# Patient Record
Sex: Female | Born: 1960 | Race: Black or African American | Hispanic: No | Smoking: Never smoker
Health system: Southern US, Community
[De-identification: ages and names within clinical notes are randomized; demographics above are authoritative.]

## PROBLEM LIST (undated history)

## (undated) DIAGNOSIS — I10 Essential (primary) hypertension: Secondary | ICD-10-CM

## (undated) DIAGNOSIS — E119 Type 2 diabetes mellitus without complications: Secondary | ICD-10-CM

## (undated) DIAGNOSIS — E785 Hyperlipidemia, unspecified: Secondary | ICD-10-CM

## (undated) HISTORY — PX: CHOLECYSTECTOMY: SHX55

## (undated) HISTORY — PX: ABDOMINAL HYSTERECTOMY: SHX81

---

## 2014-09-22 ENCOUNTER — Encounter (HOSPITAL_BASED_OUTPATIENT_CLINIC_OR_DEPARTMENT_OTHER): Payer: Self-pay

## 2014-09-22 ENCOUNTER — Emergency Department (HOSPITAL_BASED_OUTPATIENT_CLINIC_OR_DEPARTMENT_OTHER)
Admission: EM | Admit: 2014-09-22 | Discharge: 2014-09-22 | Disposition: A | Discharge status: Home / Self Care | Payer: BC Managed Care – PPO | Attending: Emergency Medicine | Admitting: Emergency Medicine

## 2014-09-22 ENCOUNTER — Emergency Department (HOSPITAL_BASED_OUTPATIENT_CLINIC_OR_DEPARTMENT_OTHER): Payer: BC Managed Care – PPO

## 2014-09-22 DIAGNOSIS — I10 Essential (primary) hypertension: Secondary | ICD-10-CM | POA: Diagnosis not present

## 2014-09-22 DIAGNOSIS — E785 Hyperlipidemia, unspecified: Secondary | ICD-10-CM | POA: Insufficient documentation

## 2014-09-22 DIAGNOSIS — Y9389 Activity, other specified: Secondary | ICD-10-CM | POA: Diagnosis not present

## 2014-09-22 DIAGNOSIS — Z791 Long term (current) use of non-steroidal anti-inflammatories (NSAID): Secondary | ICD-10-CM | POA: Diagnosis not present

## 2014-09-22 DIAGNOSIS — W010XXA Fall on same level from slipping, tripping and stumbling without subsequent striking against object, initial encounter: Secondary | ICD-10-CM | POA: Insufficient documentation

## 2014-09-22 DIAGNOSIS — S99922A Unspecified injury of left foot, initial encounter: Secondary | ICD-10-CM | POA: Diagnosis present

## 2014-09-22 DIAGNOSIS — Z79899 Other long term (current) drug therapy: Secondary | ICD-10-CM | POA: Diagnosis not present

## 2014-09-22 DIAGNOSIS — Y92009 Unspecified place in unspecified non-institutional (private) residence as the place of occurrence of the external cause: Secondary | ICD-10-CM | POA: Insufficient documentation

## 2014-09-22 DIAGNOSIS — Y998 Other external cause status: Secondary | ICD-10-CM | POA: Insufficient documentation

## 2014-09-22 DIAGNOSIS — E119 Type 2 diabetes mellitus without complications: Secondary | ICD-10-CM | POA: Insufficient documentation

## 2014-09-22 DIAGNOSIS — Z880 Allergy status to penicillin: Secondary | ICD-10-CM | POA: Diagnosis not present

## 2014-09-22 DIAGNOSIS — S93602A Unspecified sprain of left foot, initial encounter: Secondary | ICD-10-CM | POA: Diagnosis not present

## 2014-09-22 HISTORY — DX: Essential (primary) hypertension: I10

## 2014-09-22 HISTORY — DX: Hyperlipidemia, unspecified: E78.5

## 2014-09-22 HISTORY — DX: Type 2 diabetes mellitus without complications: E11.9

## 2014-09-22 MED ORDER — TRAMADOL HCL 50 MG PO TABS: 50.0000 mg | ORAL_TABLET | Freq: Four times a day (QID) | ORAL | Status: AC | PRN

## 2014-09-22 MED ORDER — MELOXICAM 7.5 MG PO TABS: 7.5000 mg | ORAL_TABLET | Freq: Every day | ORAL | Status: AC

## 2014-09-22 MED ORDER — HYDROCODONE-ACETAMINOPHEN 5-325 MG PO TABS
1.0000 | ORAL_TABLET | Freq: Once | ORAL | Status: AC
  Administered 2014-09-22: 1 via ORAL
  Filled 2014-09-22: qty 1

## 2014-09-22 NOTE — ED Notes (Signed)
Anna Fernandez EMT is teaching Pt. About ASO and Crutch teaching with Pt.   Pt. Verbalized d'c instructions.

## 2014-09-22 NOTE — ED Notes (Signed)
Left foot pain. Reports tripping yesterday. No swelling ntoed.

## 2014-09-22 NOTE — ED Provider Notes (Signed)
CSN: [id number]     Arrival date & time 09/22/14  1740 History   First MD Initiated Contact with Patient 09/22/14 1833     Chief Complaint  Patient presents with  . Foot Pain     (Consider location/radiation/quality/duration/timing/severity/associated sxs/prior Treatment) HPI  Pt is a 54yo female presenting to ED with c/o gradually worsening Left foot pain that started yesterday after pt tripped on a step at home. Denies falling or hitting her head. No other injuries. Pain is aching and throbbing, 10/10 at worst. Worse with weight bearing. Advil tried at home w/o relief. No other injuries.   Past Medical History  Diagnosis Date  . Diabetes mellitus without complication   . Hypertension   . Hyperlipidemia    Past Surgical History  Procedure Laterality Date  . Abdominal hysterectomy    . Cholecystectomy     No family history on file. History  Substance Use Topics  . Smoking status: Never Smoker   . Smokeless tobacco: Not on file  . Alcohol Use: No   OB History    No data available     Review of Systems  Musculoskeletal: Positive for myalgias, joint swelling, arthralgias and gait problem. Negative for back pain, neck pain and neck stiffness.       Left foot  Skin: Negative for color change and wound.  All other systems reviewed and are negative.     Allergies  Penicillins  Home Medications   Prior to Admission medications   Medication Sig Start Date End Date Taking? Authorizing Provider  lamoTRIgine (LAMICTAL) 100 MG tablet Take 100 mg by mouth daily.   Yes Historical Provider, MD  meloxicam (MOBIC) 7.5 MG tablet Take 1 tablet (7.5 mg total) by mouth daily. 09/22/14   Junius FinnerErin O'Malley, PA-C  metFORMIN (GLUCOPHAGE) 500 MG tablet Take 500 mg by mouth 2 (two) times daily with a meal.   Yes Historical Provider, MD  rosuvastatin (CRESTOR) 20 MG tablet Take 20 mg by mouth daily.   Yes Historical Provider, MD  topiramate (TOPAMAX) 100 MG tablet Take 100 mg by mouth 2 (two)  times daily.   Yes Historical Provider, MD  traMADol (ULTRAM) 50 MG tablet Take 1 tablet (50 mg total) by mouth every 6 (six) hours as needed. 09/22/14   Junius FinnerErin O'Malley, PA-C  triamterene-hydrochlorothiazide (MAXZIDE-25) 37.5-25 MG per tablet Take 1 tablet by mouth daily.   Yes Historical Provider, MD   BP 157/70 mmHg  Pulse 77  Temp(Src) 98.6 F (37 C) (Oral)  Resp 14  Ht 5\' 3"  (1.6 m)  Wt 179 lb (81.194 kg)  BMI 31.72 kg/m2  SpO2 98% Physical Exam  Constitutional: She is oriented to person, place, and time. She appears well-developed and well-nourished.  HENT:  Head: Normocephalic and atraumatic.  Eyes: EOM are normal.  Neck: Normal range of motion.  Cardiovascular: Normal rate.   Pulses:      Dorsalis pedis pulses are 2+ on the left side.  Pulmonary/Chest: Effort normal.  Musculoskeletal: Normal range of motion. She exhibits edema and tenderness.  Left foot: tenderness to dorsal proximal aspect, moderate edema to lateral side of foot and ankle w/o tenderness. FROM left ankle and all 5 toes No calf tenderness  Neurological: She is alert and oriented to person, place, and time.  Skin: Skin is warm and dry.  Skin in tact. No ecchymosis or erythema  Psychiatric: She has a normal mood and affect. Her behavior is normal.  Nursing note and vitals reviewed.   ED  Course  Procedures (including critical care time) Labs Review Labs Reviewed - No data to display  Imaging Review Dg Foot Complete Left  09/22/2014   CLINICAL DATA:  Anterior left foot pain after missing a step last night.  EXAM: LEFT FOOT - COMPLETE 3+ VIEW  COMPARISON:  None.  FINDINGS: Dorsal spur formation at the tarsal/metatarsal joints on the lateral view. Mild dorsal soft tissue swelling. No fracture or dislocation seen.  IMPRESSION: 1. No fracture. 2. Tarsal/metatarsal degenerative changes.   Electronically Signed   By: Beckie SaltsSteven  Reid M.D.   On: 09/22/2014 18:52     EKG Interpretation None      MDM   Final  diagnoses:  Foot sprain, left, initial encounter  Fall from slip, trip, or stumble, initial encounter    Pt c/o Left foot pain after a trip. No other injuries. Plain films: negative for fractures. Will tx as sprain. ASO and crutches provided. Rx: tramadol and mobic. Home care instructions provided. F/u with PCP and Dr. Pearletha ForgeHudnall in 1-2 weeks if symptoms not improving. Pt verbalized understanding and agreement with tx plan.     Junius Finnerrin O'Malley, PA-C 09/23/14 [phone]  Jerelyn ScottMartha Linker, MD 09/24/14 [phone]

## 2016-12-16 IMAGING — DX DG FOOT COMPLETE 3+V*L*
3 series · 3 of 3 positions shown · non-contrast
Comparison: None.

CLINICAL DATA: Anterior left foot pain after missing a step last
night.

EXAM:
LEFT FOOT - COMPLETE 3+ VIEW

[foot ap]
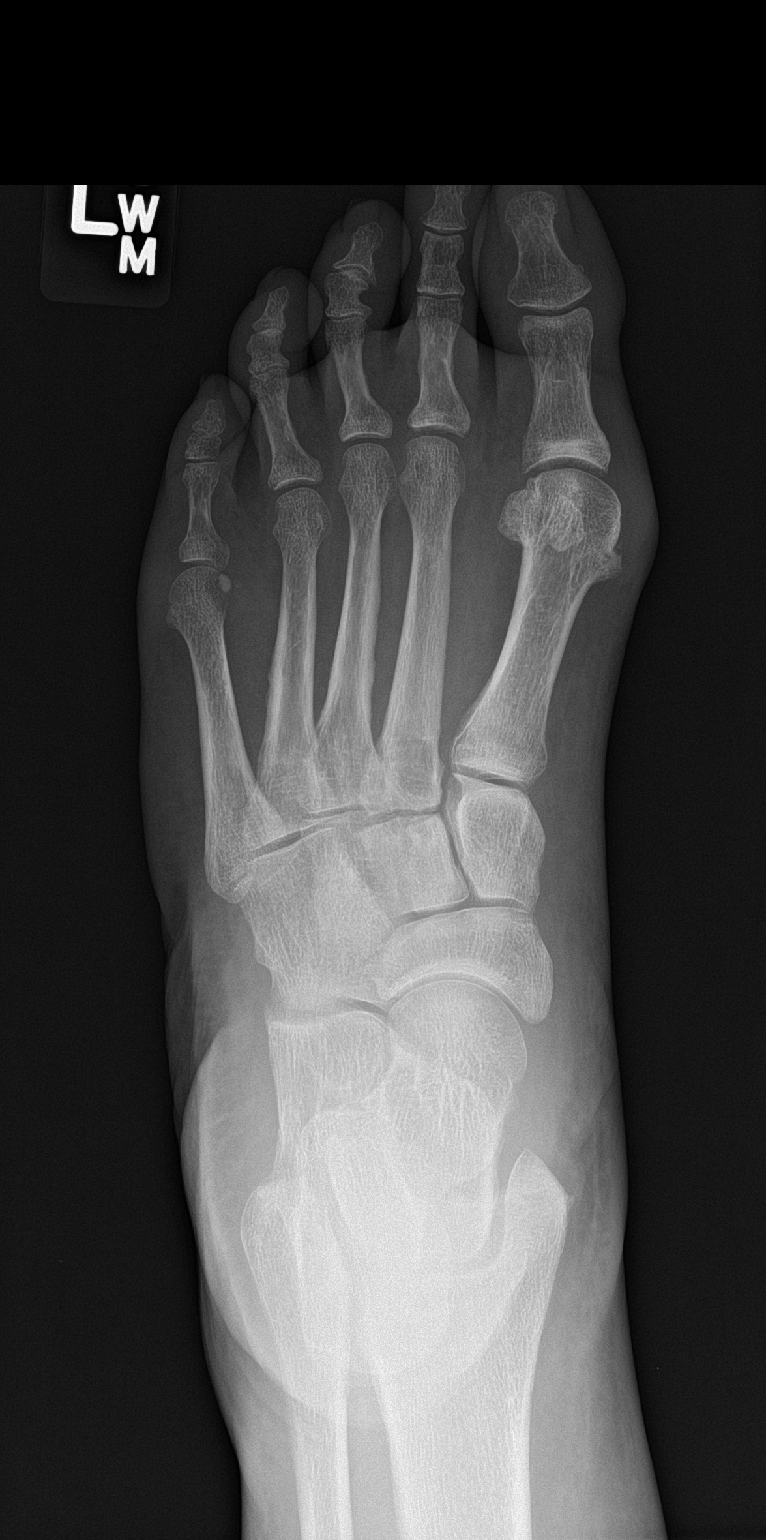

[foot obl]
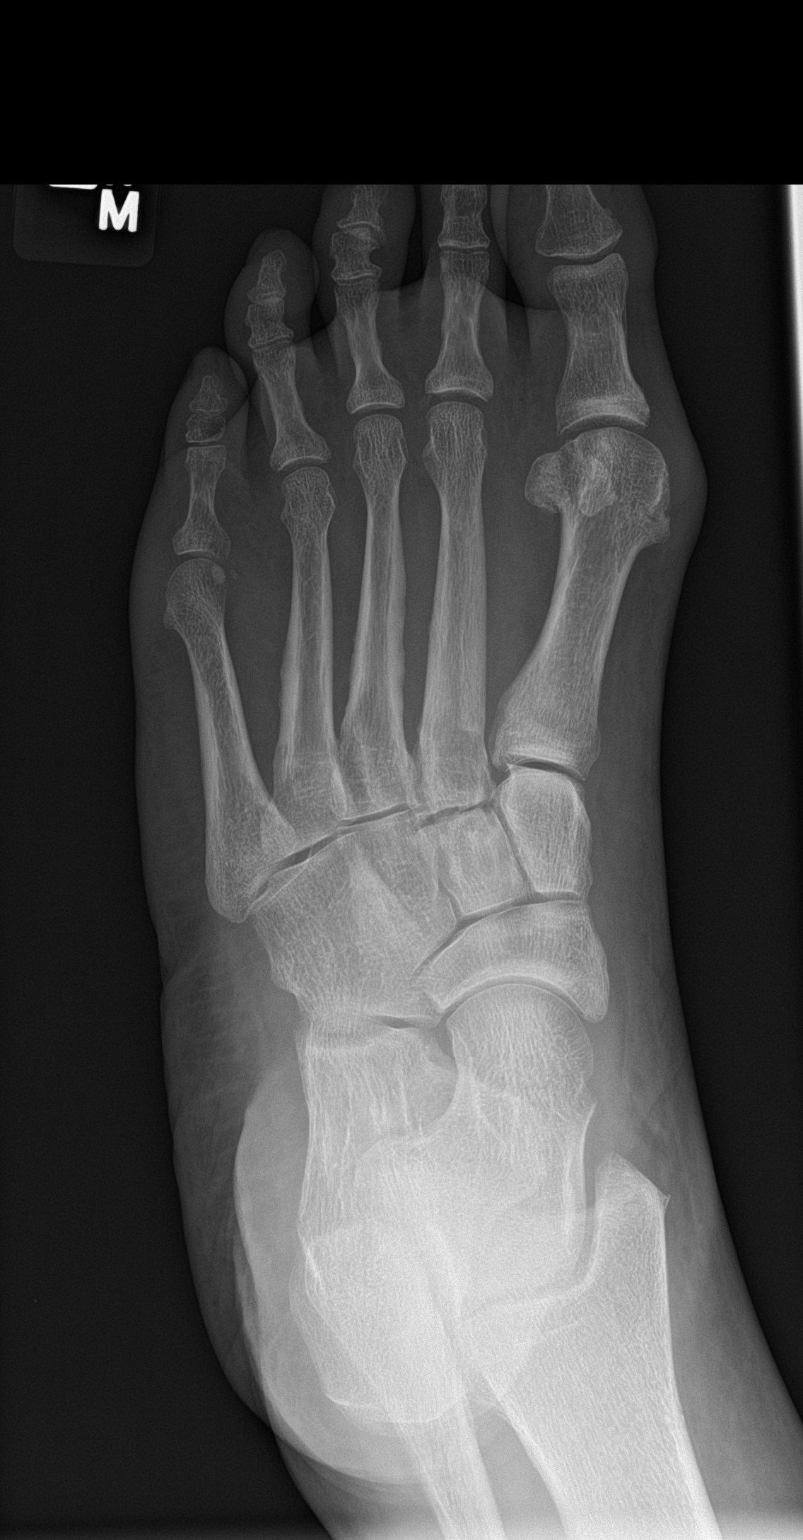

[foot lat]
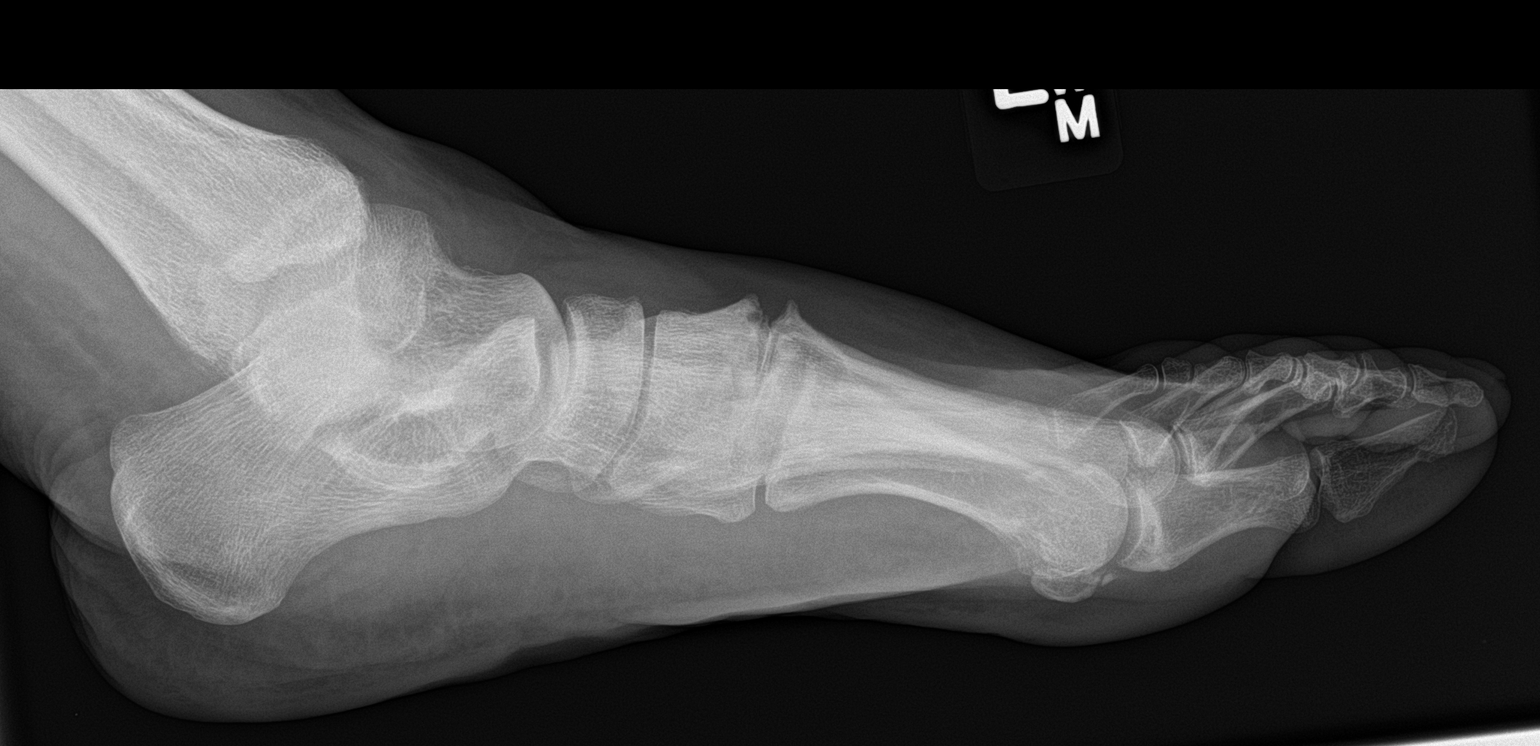

[3 of 3 positions shown; findings below may reference images not displayed]

FINDINGS: Dorsal spur formation at the tarsal/metatarsal joints on the lateral
view. Mild dorsal soft tissue swelling. No fracture or dislocation
seen.
IMPRESSION: 1. No fracture.
2. Tarsal/metatarsal degenerative changes.
